# Patient Record
Sex: Male | Born: 2000 | Race: White | Hispanic: No | Marital: Single | Smoking: Never smoker
Health system: Southern US, Community
[De-identification: ages and names within clinical notes are randomized; demographics above are authoritative.]

## PROBLEM LIST (undated history)

## (undated) DIAGNOSIS — J45909 Unspecified asthma, uncomplicated: Secondary | ICD-10-CM

## (undated) HISTORY — PX: ADENOIDECTOMY: SUR15

---
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
## 2021-03-06 ENCOUNTER — Encounter: Payer: Self-pay | Admitting: Nurse Practitioner

## 2021-03-06 ENCOUNTER — Other Ambulatory Visit: Payer: Self-pay

## 2021-03-06 ENCOUNTER — Telehealth (INDEPENDENT_AMBULATORY_CARE_PROVIDER_SITE_OTHER): Payer: 59 | Admitting: Nurse Practitioner

## 2021-03-06 DIAGNOSIS — J069 Acute upper respiratory infection, unspecified: Secondary | ICD-10-CM

## 2021-03-06 NOTE — Progress Notes (Signed)
Subjective:    Patient ID: Marvin Mcknight, male    DOB: Sep 14, 2000, 21 y.o.   MRN: [id number]  HPI: Marvin Mcknight is a 21 y.o. male presenting virtually for fever.   Chief Complaint  Patient presents with   Fever   UPPER RESPIRATORY TRACT INFECTION Onset: last night after work Fever: yes Cough: no Shortness of breath: no Wheezing: no Chest pain: no Chest tightness: no Chest congestion: no Nasal congestion: no Runny nose: no Post nasal drip: no Sneezing: no Sore throat: no Swollen glands: yes Sinus pressure: yes; above eyes Headache: no Face pain: no Toothache: no Ear pain: no  Ear pressure: no  Eyes red/itching:no Eye drainage/crusting: no  Nausea: no  Vomiting: no Diarrhea: no  Change in appetite:  yes; decreased   Abdominal pain: no Loss of taste/smell: no  Rash: no Fatigue: yes Sick contacts: no Strep contacts: no  Context: stable Recurrent sinusitis: no Treatments attempted: Tylenol sinus Relief with OTC medications:  yes COVID testing: negative at home  No Known Allergies  Outpatient Encounter Medications as of 03/06/2021  Medication Sig   Albuterol Sulfate (PROAIR RESPICLICK) 108 (90 Base) MCG/ACT AEPB Inhale 2 puffs into the lungs every 4 (four) hours as needed.   Erenumab-aooe (AIMOVIG) 70 MG/ML SOAJ Inject 70 mg into the skin every 30 (thirty) days.   mometasone (ELOCON) 0.1 % cream Apply 1 application topically daily.   rizatriptan (MAXALT-MLT) 10 MG disintegrating tablet Take 1 tablet (10 mg total) by mouth as needed for migraine. May repeat in 2 hours if needed   No facility-administered encounter medications on file as of 03/06/2021.    There are no problems to display for this patient.   Past Medical History:  Diagnosis Date   Asthma     Relevant past medical, surgical, family and social history reviewed and updated as indicated. Interim medical history since our last visit reviewed.  Review of Systems Per HPI unless  specifically indicated above     Objective:    There were no vitals taken for this visit.  Wt Readings from Last 3 Encounters:  05/11/20 167 lb (75.8 kg) (73 %, Z= 0.63)*  04/06/20 171 lb (77.6 kg) (78 %, Z= 0.77)*  01/26/20 175 lb (79.4 kg) (82 %, Z= 0.92)*   * Growth percentiles are based on CDC (Boys, 2-20 Years) data.    Physical Exam Physical examination unable to be performed due to lack of equipment.  Patient talking in complete sentences during telemedicine visit.    Assessment & Plan:  1. Viral upper respiratory tract infection Acute x 1 day.  Obtain respiratory panel testing.  Reassured patient that symptoms are most consistent with a viral upper respiratory infection and explained lack of efficacy of antibiotics against viruses.  Discussed expected course and features suggestive of secondary bacterial infection.  Continue supportive care. Increase fluid intake with water or electrolyte solution like pedialyte. Encouraged acetaminophen as needed for fever/pain. Encouraged salt water gargling, chloraseptic spray and throat lozenges. Encouraged OTC guaifenesin. Encouraged saline sinus flushes and/or neti with humidified air. Follow up with no improvement after 7-10 days of symptoms.  - SARS-CoV-2 RNA (COVID-19) and Respiratory Viral Panel, Qualitative NAAT; Future    Follow up plan: Return if symptoms worsen or fail to improve.  This visit was completed via telephone due to the restrictions of the COVID-19 pandemic. All issues as above were discussed and addressed but no physical exam was performed. If it was felt that the patient  should be evaluated in the office, they were directed there. The patient verbally consented to this visit. Patient was unable to complete an audio/visual visit due to Technical difficulties.  Patient's cell phone camera would not allow access to [employer] application. Location of the patient: home Location of the provider: work Those involved with this  call:  Provider: Cathlean Marseilles, DNP, FNP-C CMA: Darrol Angel, CMA Front Desk/Registration: Claudine Mouton  Time spent on call:  5 minutes on the phone discussing health concerns. 12 minutes total spent in review of patient's record and preparation of their chart. I verified patient identity using two factors (patient name and date of birth). Patient consents verbally to being seen via telemedicine visit today.

## 2021-03-10 LAB — SARS-COV-2 RNA (COVID-19) RESP VIRAL PNL QL NAAT

## 2021-03-15 ENCOUNTER — Telehealth: Payer: Self-pay

## 2021-03-15 NOTE — Telephone Encounter (Signed)
Spoke with pt's mom, advised her without a DPR in file we cannot discuss his health/testing with her. She voiced understanding. She is concerned about pt's rash not improving. She reports pt has been to derm and they gave him oral pred and atbx but these are not helping. She is worried pt may have Mono and wants him tested for this. Advised pt needs appt to discuss further. She scheduled him an appt for this coming Monday, noted that pt needs to complete DPR. Nothing further needed at this time.

## 2021-03-15 NOTE — Telephone Encounter (Signed)
Pt's mom called in stating that pt was seen virtually by NP. Pt's mom states that rash is not getting better and would like to know what he was tested for? Please advise  Cb#: [id number]

## 2021-03-18 ENCOUNTER — Ambulatory Visit: Payer: 59 | Admitting: Family Medicine

## 2021-03-19 ENCOUNTER — Ambulatory Visit (INDEPENDENT_AMBULATORY_CARE_PROVIDER_SITE_OTHER): Payer: 59 | Admitting: Family Medicine

## 2021-03-19 ENCOUNTER — Encounter: Payer: Self-pay | Admitting: Family Medicine

## 2021-03-19 ENCOUNTER — Other Ambulatory Visit: Payer: Self-pay

## 2021-03-19 VITALS — BP 128/68 | HR 84 | Temp 98.2°F | Resp 18 | Ht 73.5 in | Wt 190.0 lb

## 2021-03-19 DIAGNOSIS — Z202 Contact with and (suspected) exposure to infections with a predominantly sexual mode of transmission: Secondary | ICD-10-CM | POA: Diagnosis not present

## 2021-03-19 DIAGNOSIS — L299 Pruritus, unspecified: Secondary | ICD-10-CM | POA: Diagnosis not present

## 2021-03-19 MED ORDER — PREDNISONE 20 MG PO TABS: ORAL_TABLET | ORAL | 0 refills | Status: DC

## 2021-03-19 MED ORDER — PERMETHRIN 5 % EX CREA: 1.0000 "application " | TOPICAL_CREAM | Freq: Once | CUTANEOUS | 2 refills | Status: AC

## 2021-03-19 NOTE — Progress Notes (Signed)
Subjective:    Patient ID: Marvin Mcknight, male    DOB: [birth year], 21 y.o.   MRN: [id number]       Patient is a very pleasant 21 year old Caucasian gentleman who presents today with itching all over his body.  He went to an emergency room to be treated for possible poison ivy.  There they gave him prednisone with no relief.  He also went to a dermatologist who prescribed an antibiotic, hydroxyzine, as well as a steroid cream.  None of this is helping the itching.  The itching is diffuse and widespread all over his body.  Particular he reports itching in between the webspaces of his fingers, his pelvic region, and his lower abdomen, and in his axilla.  There is not much of a rash to see.  There are few slightly erythematous small 1 mm papules.  There is numerous excoriations.  Rash seems to be limited to the webspaces of the fingers.  There are no vesicles or erythema or papules anywhere else.  He would also request STD testing Past Medical History:  Diagnosis Date   Asthma    History reviewed. No pertinent surgical history. Current Outpatient Medications on File Prior to Visit  Medication Sig Dispense Refill   Albuterol Sulfate (PROAIR RESPICLICK) 108 (90 Base) MCG/ACT AEPB Inhale 2 puffs into the lungs every 4 (four) hours as needed. 1 each 0   augmented betamethasone dipropionate (DIPROLENE-AF) 0.05 % cream Apply 1 application topically 2 (two) times daily.     Clindamycin-Benzoyl Per, Refr, gel Apply 1 application topically 2 (two) times daily.     doxycycline (VIBRAMYCIN) 100 MG capsule Take 100 mg by mouth 2 (two) times daily.     Erenumab-aooe (AIMOVIG) 70 MG/ML SOAJ Inject 70 mg into the skin every 30 (thirty) days. 1 pen 3   hydrOXYzine (ATARAX) 25 MG tablet Take 25 mg by mouth 3 (three) times daily as needed.     mometasone (ELOCON) 0.1 % cream Apply 1 application topically daily. 45 g 0   rizatriptan (MAXALT-MLT) 10 MG disintegrating tablet Take 1 tablet (10 mg total)  by mouth as needed for migraine. May repeat in 2 hours if needed 10 tablet 0   tretinoin (RETIN-A) 0.1 % cream Apply 1 application topically 3 (three) times a week.     No current facility-administered medications on file prior to visit.   No Known Allergies Social History   Socioeconomic History   Marital status: Single    Spouse name: Not on file   Number of children: Not on file   Years of education: Not on file   Highest education level: Not on file  Occupational History   Not on file  Tobacco Use   Smoking status: Never   Smokeless tobacco: Never  Substance and Sexual Activity   Alcohol use: Not on file   Drug use: Not on file   Sexual activity: Not on file  Other Topics Concern   Not on file  Social History Narrative   Not on file   Social Determinants of Health   Financial Resource Strain: Not on file  Food Insecurity: Not on file  Transportation Needs: Not on file  Physical Activity: Not on file  Stress: Not on file  Social Connections: Not on file  Intimate Partner Violence: Not on file   History reviewed. No pertinent family history. (dad may have add tendencies per mom) Mom has GERD and osteoarthritis     Review of Systems  All  other systems reviewed and are negative.     Objective:   Physical Exam Vitals reviewed.  Constitutional:      General: He is not in acute distress.    Appearance: He is well-developed. He is not diaphoretic.  HENT:     Head: Normocephalic and atraumatic.     Right Ear: Tympanic membrane and ear canal normal.     Left Ear: Tympanic membrane and ear canal normal.  Eyes:     Extraocular Movements: Extraocular movements intact.     Conjunctiva/sclera: Conjunctivae normal.     Pupils: Pupils are equal, round, and reactive to light.  Neck:     Thyroid: No thyroid mass.     Trachea: Trachea normal.  Cardiovascular:     Rate and Rhythm: Normal rate and regular rhythm.     Heart sounds: Normal heart sounds. No  murmur heard.   No friction rub. No gallop.  Pulmonary:     Effort: Pulmonary effort is normal. No respiratory distress.     Breath sounds: Normal breath sounds. No wheezing or rales.  Musculoskeletal:     Cervical back: Full passive range of motion without pain and normal range of motion.  Skin:    Findings: Rash present.  Neurological:     General: No focal deficit present.     Mental Status: He is alert and oriented to person, place, and time. Mental status is at baseline.     Cranial Nerves: No cranial nerve deficit.     Motor: No weakness or abnormal muscle tone.     Coordination: Coordination normal.     Gait: Gait normal.     Deep Tendon Reflexes: Reflexes are normal and symmetric.  Psychiatric:        Behavior: Behavior normal.        Thought Content: Thought content normal.        Judgment: Judgment normal.          Assessment & Plan:  Pruritus - Plan: CBC with Differential/Platelet, COMPLETE METABOLIC PANEL WITH GFR  Possible exposure to STD - Plan: HIV Antibody (routine testing w rflx), RPR I suspect the patient may have scabies.  I will treat him with Elimite cream head to toe and rinse off after 8 hours.  I will give him prednisone for itching.  Repeat Elimite cream in 1 week.  Check CBC and CMP to evaluate for any polycythemia or elevated liver function test although his exam today appears normal.  Also performed STD testing as requested

## 2021-03-20 LAB — CBC WITH DIFFERENTIAL/PLATELET
Absolute Monocytes: 476 cells/uL (ref 200–950)
Basophils Absolute: 48 cells/uL (ref 0–200)
Basophils Relative: 0.7 %
Eosinophils Absolute: 262 cells/uL (ref 15–500)
Eosinophils Relative: 3.8 %
HCT: 44.4 % (ref 38.5–50.0)
Hemoglobin: 14.7 g/dL (ref 13.2–17.1)
Lymphs Abs: 2208 cells/uL (ref 850–3900)
MCH: 28.5 pg (ref 27.0–33.0)
MCHC: 33.1 g/dL (ref 32.0–36.0)
MCV: 86.2 fL (ref 80.0–100.0)
MPV: 9.7 fL (ref 7.5–12.5)
Monocytes Relative: 6.9 %
Neutro Abs: 3905 cells/uL (ref 1500–7800)
Neutrophils Relative %: 56.6 %
Platelets: 328 10*3/uL (ref 140–400)
RBC: 5.15 10*6/uL (ref 4.20–5.80)
RDW: 12.5 % (ref 11.0–15.0)
Total Lymphocyte: 32 %
WBC: 6.9 10*3/uL (ref 3.8–10.8)

## 2021-03-20 LAB — RPR: RPR Ser Ql: NONREACTIVE

## 2021-03-20 LAB — COMPLETE METABOLIC PANEL WITH GFR
AG Ratio: 1.8 (calc) (ref 1.0–2.5)
ALT: 26 U/L (ref 8–46)
AST: 25 U/L (ref 12–32)
Albumin: 4.4 g/dL (ref 3.6–5.1)
Alkaline phosphatase (APISO): 49 U/L (ref 46–169)
BUN: 9 mg/dL (ref 7–20)
CO2: 26 mmol/L (ref 20–32)
Calcium: 9.9 mg/dL (ref 8.9–10.4)
Chloride: 99 mmol/L (ref 98–110)
Creat: 0.92 mg/dL (ref 0.60–1.24)
Globulin: 2.4 g/dL (calc) (ref 2.1–3.5)
Glucose, Bld: 115 mg/dL — ABNORMAL HIGH (ref 65–99)
Potassium: 4.1 mmol/L (ref 3.8–5.1)
Sodium: 140 mmol/L (ref 135–146)
Total Bilirubin: 0.7 mg/dL (ref 0.2–1.1)
Total Protein: 6.8 g/dL (ref 6.3–8.2)
eGFR: 123 mL/min/{1.73_m2} (ref >=60)

## 2021-03-20 LAB — HIV ANTIBODY (ROUTINE TESTING W REFLEX): HIV 1&2 Ab, 4th Generation: NONREACTIVE

## 2021-03-29 ENCOUNTER — Other Ambulatory Visit: Payer: Self-pay

## 2021-03-29 ENCOUNTER — Ambulatory Visit (INDEPENDENT_AMBULATORY_CARE_PROVIDER_SITE_OTHER): Payer: 59 | Admitting: Family Medicine

## 2021-03-29 ENCOUNTER — Encounter: Payer: Self-pay | Admitting: Family Medicine

## 2021-03-29 VITALS — BP 122/72 | HR 80 | Temp 98.1°F | Resp 18 | Ht 73.5 in | Wt 190.0 lb

## 2021-03-29 DIAGNOSIS — L299 Pruritus, unspecified: Secondary | ICD-10-CM | POA: Diagnosis not present

## 2021-03-29 MED ORDER — PREDNISONE 20 MG PO TABS: ORAL_TABLET | ORAL | 0 refills | Status: DC

## 2021-03-29 MED ORDER — PERMETHRIN 5 % EX CREA
TOPICAL_CREAM | Freq: Once | CUTANEOUS | 1 refills | Status: AC
Start: 2021-03-29 — End: 2021-03-29

## 2021-03-29 NOTE — Progress Notes (Signed)
Subjective:    Patient ID: Marvin Mcknight, male    DOB: 2000-09-01, 21 y.o.   MRN: [id number]  03/19/21 Patient is a very pleasant 21 year old Caucasian gentleman who presents today with itching all over his body.  He went to an emergency room to be treated for possible poison ivy.  There they gave him prednisone with no relief.  He also went to a dermatologist who prescribed an antibiotic, hydroxyzine, as well as a steroid cream.  None of this is helping the itching.  The itching is diffuse and widespread all over his body.  Particular he reports itching in between the webspaces of his fingers, his pelvic region, and his lower abdomen, and in his axilla.  There is not much of a rash to see.  There are few slightly erythematous small 1 mm papules.  There is numerous excoriations.  Rash seems to be limited to the webspaces of the fingers.  There are no vesicles or erythema or papules anywhere else.  He would also request STD testing.  At that time, my plan was:  I suspect the patient may have scabies.  I will treat him with Elimite cream head to toe and rinse off after 8 hours.  I will give him prednisone for itching.  Repeat Elimite cream in 1 week.  Check CBC and CMP to evaluate for any polycythemia or elevated liver function test although his exam today appears normal.  Also performed STD testing as requested  03/29/21 Patient states that he is doing better with the rash.  He states that the itching has improved substantially but he still has itching at times.  He is not sure that he has done the second dose of the Elimite?  At times he gives me conflicting information.  Sometimes he states that he is putting the cream on every night.  At other times he states that he has not done the second dose.  I asked him several different ways but I am unable to get a clear response.  It does seem that he is improving after using the Elimite.  Therefore I believe that he has some residual itching due to the  presence of the arthropods under the skin that have not completely decayed  Past Medical History:  Diagnosis Date   Asthma    No past surgical history on file. Current Outpatient Medications on File Prior to Visit  Medication Sig Dispense Refill   Albuterol Sulfate (PROAIR RESPICLICK) 108 (90 Base) MCG/ACT AEPB Inhale 2 puffs into the lungs every 4 (four) hours as needed. 1 each 0   augmented betamethasone dipropionate (DIPROLENE-AF) 0.05 % cream Apply 1 application topically 2 (two) times daily.     Erenumab-aooe (AIMOVIG) 70 MG/ML SOAJ Inject 70 mg into the skin every 30 (thirty) days. 1 pen 3   permethrin (ELIMITE) 5 % cream Apply topically.     rizatriptan (MAXALT-MLT) 10 MG disintegrating tablet Take 1 tablet (10 mg total) by mouth as needed for migraine. May repeat in 2 hours if needed 10 tablet 0   No current facility-administered medications on file prior to visit.   Allergies  Allergen Reactions   Fire Ant Anaphylaxis, Hives, Itching, Palpitations and Shortness Of Breath   Social History   Socioeconomic History   Marital status: Single    Spouse name: Not on file   Number of children: Not on file   Years of education: Not on file   Highest education level: Not on file  Occupational  History   Not on file  Tobacco Use   Smoking status: Never   Smokeless tobacco: Never  Substance and Sexual Activity   Alcohol use: Not on file   Drug use: Not on file   Sexual activity: Not on file  Other Topics Concern   Not on file  Social History Narrative   Not on file   Social Determinants of Health   Financial Resource Strain: Not on file  Food Insecurity: Not on file  Transportation Needs: Not on file  Physical Activity: Not on file  Stress: Not on file  Social Connections: Not on file  Intimate Partner Violence: Not on file   No family history on file. (dad may have add tendencies per mom) Mom has GERD and osteoarthritis     Review of Systems  All other systems  reviewed and are negative.     Objective:   Physical Exam Vitals reviewed.  Constitutional:      General: He is not in acute distress.    Appearance: He is well-developed. He is not diaphoretic.  HENT:     Head: Normocephalic and atraumatic.     Right Ear: Tympanic membrane and ear canal normal.     Left Ear: Tympanic membrane and ear canal normal.  Eyes:     Extraocular Movements: Extraocular movements intact.     Conjunctiva/sclera: Conjunctivae normal.     Pupils: Pupils are equal, round, and reactive to light.  Neck:     Thyroid: No thyroid mass.     Trachea: Trachea normal.  Cardiovascular:     Rate and Rhythm: Normal rate and regular rhythm.     Heart sounds: Normal heart sounds. No murmur heard.   No friction rub. No gallop.  Pulmonary:     Effort: Pulmonary effort is normal. No respiratory distress.     Breath sounds: Normal breath sounds. No wheezing or rales.  Musculoskeletal:     Cervical back: Full passive range of motion without pain and normal range of motion.  Skin:    Findings: Rash present.  Neurological:     General: No focal deficit present.     Mental Status: He is alert and oriented to person, place, and time. Mental status is at baseline.     Cranial Nerves: No cranial nerve deficit.     Motor: No weakness or abnormal muscle tone.     Coordination: Coordination normal.     Gait: Gait normal.     Deep Tendon Reflexes: Reflexes are normal and symmetric.  Psychiatric:        Behavior: Behavior normal.        Thought Content: Thought content normal.        Judgment: Judgment normal.          Assessment & Plan:  Pruritus The only rashes visible today are scratches in the webspace between his thumb and his index finger bilaterally.  He complains of some itching on his lower legs but there is no visible rash.  I recommended 1 additional dose of Elimite applied head to toe and rinse off after 8 hours.  This should be an adequate dose to totally  eliminate any live parasites.  I then recommended prednisone to help with the itching as I anticipate it may take another 7 days to completely resolve.

## 2021-04-04 ENCOUNTER — Ambulatory Visit: Payer: 59 | Admitting: Allergy

## 2021-05-22 ENCOUNTER — Ambulatory Visit: Payer: 59 | Admitting: Internal Medicine

## 2021-06-28 ENCOUNTER — Ambulatory Visit (INDEPENDENT_AMBULATORY_CARE_PROVIDER_SITE_OTHER): Payer: 59 | Admitting: Internal Medicine

## 2021-06-28 ENCOUNTER — Ambulatory Visit (INDEPENDENT_AMBULATORY_CARE_PROVIDER_SITE_OTHER): Payer: 59 | Admitting: Family Medicine

## 2021-06-28 ENCOUNTER — Encounter: Payer: Self-pay | Admitting: Internal Medicine

## 2021-06-28 VITALS — BP 118/56 | HR 74 | Temp 97.9°F | Ht 73.0 in | Wt 189.0 lb

## 2021-06-28 VITALS — BP 110/70 | HR 62 | Temp 98.4°F | Resp 16 | Ht 73.0 in | Wt 187.0 lb

## 2021-06-28 DIAGNOSIS — J4530 Mild persistent asthma, uncomplicated: Secondary | ICD-10-CM | POA: Diagnosis not present

## 2021-06-28 DIAGNOSIS — J3089 Other allergic rhinitis: Secondary | ICD-10-CM

## 2021-06-28 DIAGNOSIS — L503 Dermatographic urticaria: Secondary | ICD-10-CM | POA: Diagnosis not present

## 2021-06-28 DIAGNOSIS — G43109 Migraine with aura, not intractable, without status migrainosus: Secondary | ICD-10-CM | POA: Diagnosis not present

## 2021-06-28 DIAGNOSIS — H1013 Acute atopic conjunctivitis, bilateral: Secondary | ICD-10-CM | POA: Diagnosis not present

## 2021-06-28 DIAGNOSIS — L308 Other specified dermatitis: Secondary | ICD-10-CM

## 2021-06-28 DIAGNOSIS — H1010 Acute atopic conjunctivitis, unspecified eye: Secondary | ICD-10-CM

## 2021-06-28 MED ORDER — FLUTICASONE PROPIONATE 50 MCG/ACT NA SUSP: 1.0000 | Freq: Every day | NASAL | 2 refills | Status: AC

## 2021-06-28 MED ORDER — QVAR REDIHALER 40 MCG/ACT IN AERB: 2.0000 | INHALATION_SPRAY | Freq: Two times a day (BID) | RESPIRATORY_TRACT | 6 refills | Status: AC

## 2021-06-28 MED ORDER — RIZATRIPTAN BENZOATE 10 MG PO TBDP
10.0000 mg | ORAL_TABLET | ORAL | 4 refills | Status: AC | PRN
Start: 2021-06-28 — End: ?

## 2021-06-28 MED ORDER — MONTELUKAST SODIUM 10 MG PO TABS: 10.0000 mg | ORAL_TABLET | Freq: Every day | ORAL | 1 refills | Status: DC

## 2021-06-28 MED ORDER — CLOBETASOL PROPIONATE 0.05 % EX SOLN: 1.0000 "application " | Freq: Two times a day (BID) | CUTANEOUS | 0 refills | Status: AC

## 2021-06-28 MED ORDER — HYDROCORTISONE 2.5 % EX CREA: TOPICAL_CREAM | Freq: Two times a day (BID) | CUTANEOUS | 0 refills | Status: AC

## 2021-06-28 NOTE — Patient Instructions (Addendum)
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  Start Hydrocortisone cream twice a day for your eyelids   Mild persistent asthma: Not well controlled - Breathing test today showed: Inflammation in your lungs - Based on symptoms and breathing tests your asthma is now well controlled and we need to step up care  PLAN:  - Spacer sample and demonstration provided. - Daily controller medication(s):  Qvar 2 puffs twice daily  and Singulair 10 mg daily - Prior to physical activity: albuterol 2 puffs 10-15 minutes before physical activity. - Rescue medications: albuterol 4 puffs every 4-6 hours as needed - Changes during respiratory infections or worsening symptoms: Increase Qvar  to 4 puffs twice daily for TWO WEEKS. - Get Influenza Vaccine and appropriate Pneumonia and COVID 19 boosters  - Asthma control goals:  * Full participation in all desired activities (may need albuterol before activity) * Albuterol use two time or less a week on average (not counting use with activity) * Cough interfering with sleep two time or less a month * Oral steroids no more than once a year * No hospitalizations  Seasonal and perennial rhinitis: Not well controlled  - Testing today showed grass, weeds, trees, mold - Copy of test results provided.  - Avoidance measures provided.  - Start taking: Zyrtec  (cetirizine) 10mg  tablet once daily, Singulair (montelukast) 10mg  daily, Flonase (fluticasone) one spray per nostril daily, and Pataday (olopatadine) one drop per eye twice daily as needed - You can use an extra dose of the antihistamine, if needed, for breakthrough symptoms.  - Consider nasal saline rinses 1-2 times daily to remove allergens from the nasal cavities as well as help with mucous clearance (this is especially helpful to do before the nasal sprays are given) - Consider allergy shots as a means of long-term control and can reduce lifetime use of medications  - Allergy shots "re-train" and "reset" the immune system to ignore environmental allergens and decrease the resulting immune response to those allergens (sneezing, itchy watery eyes, runny nose, nasal congestion, etc).    - Allergy shots improve symptoms in 75-85%  - Allergy shots are the only potential permanent and disease modifying option  - We can discuss more at the next appointment if the medications are not working for you.  Follow up: 4 weeks   Thank you so much for letting me partake in your care today.  Don't hesitate to reach out if you have any additional concerns!  , MD  [facility] [place], [place]  Reducing Pollen Exposure  The American Academy of Allergy, Asthma and Immunology suggests the following steps to reduce your exposure to pollen during allergy seasons.    Do not hang sheets or clothing out to dry; pollen may collect on these items. Do not mow lawns or spend time around freshly cut grass; mowing stirs up pollen.  Keep windows closed at night.  Keep car windows closed while driving. Minimize morning activities outdoors, a time when pollen counts are usually at their highest. Stay indoors as much as possible when pollen counts or humidity is high and on windy days when pollen tends to remain in the air longer. Use air conditioning when possible.  Many air conditioners have  filters that trap the pollen spores. Use a HEPA room air filter to remove pollen form the indoor air you breathe.  Control of Mold Allergen   Mold and fungi can grow on a variety of surfaces provided certain temperature and moisture conditions exist.  Outdoor molds grow on plants, decaying vegetation and soil.  The major outdoor mold, Alternaria and Cladosporium, are found in very high numbers during hot and dry conditions.  Generally, a late Summer - Fall peak is seen for common outdoor fungal spores.  Rain will temporarily lower outdoor mold spore count, but counts rise rapidly when the rainy period ends.  The most important indoor molds are Aspergillus and Penicillium.  Dark, humid and poorly ventilated basements are ideal sites for mold growth.  The next most common sites of mold growth are the bathroom and the kitchen.  Outdoor (Seasonal) Mold Control  Positive outdoor molds via skin testing: Alternaria and Bipolaris (Helminthsporium)  Use air conditioning and keep windows closed Avoid exposure to decaying vegetation. Avoid leaf raking. Avoid grain handling. Consider wearing a face mask if working in moldy areas.    Indoor (Perennial) Mold Control   Positive indoor molds via skin testing: Aspergillus, Aureobasidium (Pullulara), and Phoma  Maintain humidity below 50%. Clean washable surfaces with 5% bleach solution. Remove sources e.g. contaminated carpets.

## 2021-06-28 NOTE — Progress Notes (Signed)
New Patient Note  RE: Marvin Mcknight MRN: [id number] DOB: 17-Jul-2000 Date of Office Visit: 06/28/2021  Consult requested by: Donita Brooks, MD Primary care provider: Donita Brooks, MD  Chief Complaint: Allergy Testing (Environmental: All/Food: No Hx), Eczema (Psorasis?????), and Asthma (Childhood - exercise induced Patient states he has not used his inhaler since 9th-10th grade)  History of Present Illness: I had the pleasure of seeing Helmut Hennon for initial evaluation at the [facility] on 06/28/2021. He is a 21 y.o. male, who is referred here by Donita Brooks, MD for the evaluation of allergic rhinitis, atopic dermatitis, asthma.  Chronic rhinitis: started as a young child  Symptoms include:  sinus infections, nasal congestion, rhinorrhea, post nasal drainage, and sneezing  Occurs seasonally-spring  Potential triggers: denies animal triggers  Treatments tried: denies daily meds  Previous allergy testing: no History of reflux/heartburn: no History of chronic sinusitis or sinus surgery:  denies surgery, but history of nasal trauma  Nonallergic triggers:  denies      Pruritus Daily pruritus associated with "little goose bumps".  Denies urticaria or angioedema.  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  Previously treated with 2 rounds prednisone, topical steroids which did not help  Benadryl has helped.  Does have dry flaky patches above his eyes and significant dandruff and scalp.  He is followed dermatology but has not mentioned his scalp issues to dermatology.  He is concerned for psoriasis of the scalp.  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  Current symptoms include  denies any wheeze, cough, dyspnea  0 daytime symptoms in past month, 0 nighttime awakenings in past month Using rescue inhaler denies use  Limitations to daily activity: none 0 ED visits, 0 UC visits and 0 oral steroids in the past year 0 number of lifetime hospitalizations, 0  number of lifetime intubations.  Identified Triggers: respiratory illness Prior PFTs or spirometry: none prior  Previously used therapies: albuterol .  Current regimen:  Maintenance: none  Rescue: Albuterol 2 puffs q4-6 hrs PRN, denies  prior to exercise  Up-to-date with pneumonia, vaccines. History of prior pneumonias: denies  History of prior COVID-19 infection: winter 2022  Smoking exposure: denies    Recent labs: Negative RPR, HIV; no normal CBC and CMP  Assessment and Plan: Mubashir is a 21 y.o. male with: Mild persistent asthma without complication - Plan: Spirometry with Graph, Allergy Test  Other specified dermatitis  Dermatographic urticaria - Plan: Allergy Test, Chronic Urticaria, TSH + free T4, Tryptase, C-reactive protein  Seasonal allergic conjunctivitis - Plan: Allergy Test  Other allergic rhinitis - Plan: Allergy Test Plan: Patient Instructions  Dermatographism  This can be a subtype of chronic spontaneous urticaria (CSU)  Although usually not associated with underlying conditions, we will screen these for you with urticaria labs:   TSH, tryptase, CU index, CRP Given your response to Benadryl we will go ahead and treat similarly to similarly to CSU  Start cetirizine 10 mg twice a day, can increase to cetirizine 20 mg twice a day as needed to control symptoms Goals to become completely itch and hive free before we taper antihistamines   Scalp Dermatitis  Follow-up with dermatology to discuss concerns for psoriasis We will start Clobetasol Shampoo twice daily for up to 2 weeks for scalp.  Start Hydrocortisone cream twice a day for your eyelids   Mild persistent asthma: Not well controlled - Breathing test today showed: Inflammation in your lungs - Based on  symptoms and breathing tests your asthma is now well controlled and we need to step up care  PLAN:  - Spacer sample and demonstration provided. - Daily controller medication(s):  Qvar 40mcg 2 puffs twice  daily  and Singulair 10 mg daily - Prior to physical activity: albuterol 2 puffs 10-15 minutes before physical activity. - Rescue medications: albuterol 4 puffs every 4-6 hours as needed - Changes during respiratory infections or worsening symptoms: Increase Qvar  to 4 puffs twice daily for TWO WEEKS. - Get Influenza Vaccine and appropriate Pneumonia and COVID 19 boosters  - Asthma control goals:  * Full participation in all desired activities (may need albuterol before activity) * Albuterol use two time or less a week on average (not counting use with activity) * Cough interfering with sleep two time or less a month * Oral steroids no more than once a year * No hospitalizations  Seasonal and perennial rhinitis: Not well controlled  - Testing today showed grass, weeds, trees, mold - Copy of test results provided.  - Avoidance measures provided.  - Start taking: Zyrtec (cetirizine) 10mg  tablet once daily, Singulair (montelukast) 10mg  daily, Flonase (fluticasone) one spray per nostril daily, and Pataday (olopatadine) one drop per eye twice daily as needed - You can use an extra dose of the antihistamine, if needed, for breakthrough symptoms.  - Consider nasal saline rinses 1-2 times daily to remove allergens from the nasal cavities as well as help with mucous clearance (this is especially helpful to do before the nasal sprays are given) - Consider allergy shots as a means of long-term control and can reduce lifetime use of medications  - Allergy shots "re-train" and "reset" the immune system to ignore environmental allergens and decrease the resulting immune response to those allergens (sneezing, itchy watery eyes, runny nose, nasal congestion, etc).    - Allergy shots improve symptoms in 75-85%  - Allergy shots are the only potential permanent and disease modifying option  - We can discuss more at the next appointment if the medications are not working for you.  Follow up: 4 weeks   Thank  you so much for letting me partake in your care today.  Don't hesitate to reach out if you have any additional concerns!  Ferol LuzEvelyn Alize Acy, MD  [facility] [place] [place]  Reducing Pollen Exposure  The American Academy of Allergy, Asthma and Immunology suggests the following steps to reduce your exposure to pollen during allergy seasons.    Do not hang sheets or clothing out to dry; pollen may collect on these items. Do not mow lawns or spend time around freshly cut grass; mowing stirs up pollen. Keep windows closed at night.  Keep car windows closed while driving. Minimize morning activities outdoors, a time when pollen counts are usually at their highest. Stay indoors as much as possible when pollen counts or humidity is high and on windy days when pollen tends to remain in the air longer. Use air conditioning when possible.  Many air conditioners have filters that trap the pollen spores. Use a HEPA room air filter to remove pollen form the indoor air you breathe.  Control of Mold Allergen   Mold and fungi can grow on a variety of surfaces provided certain temperature and moisture conditions exist.  Outdoor molds grow on plants, decaying vegetation and soil.  The major outdoor mold, Alternaria and Cladosporium, are found in very high numbers during hot and dry conditions.  Generally, a late Summer -  Fall peak is seen for common outdoor fungal spores.  Rain will temporarily lower outdoor mold spore count, but counts rise rapidly when the rainy period ends.  The most important indoor molds are Aspergillus and Penicillium.  Dark, humid and poorly ventilated basements are ideal sites for mold growth.  The next most common sites of mold growth are the bathroom and the kitchen.  Outdoor (Seasonal) Mold Control  Positive outdoor molds via skin testing: Alternaria and Bipolaris (Helminthsporium)  Use air conditioning and keep windows closed Avoid exposure to decaying  vegetation. Avoid leaf raking. Avoid grain handling. Consider wearing a face mask if working in moldy areas.    Indoor (Perennial) Mold Control   Positive indoor molds via skin testing: Aspergillus, Aureobasidium (Pullulara), and Phoma  Maintain humidity below 50%. Clean washable surfaces with 5% bleach solution. Remove sources e.g. contaminated carpets.       Return in about 4 weeks (around 07/26/2021).  Meds ordered this encounter  Medications   clobetasol (TEMOVATE) 0.05 % external solution    Sig: Apply 1 application. topically 2 (two) times daily.    Dispense:  50 mL    Refill:  0   beclomethasone (QVAR REDIHALER) 40 MCG/ACT inhaler    Sig: Inhale 2 puffs into the lungs 2 (two) times daily.    Dispense:  1 each    Refill:  6   hydrocortisone 2.5 % cream    Sig: Apply topically 2 (two) times daily.    Dispense:  30 g    Refill:  0   fluticasone (FLONASE) 50 MCG/ACT nasal spray    Sig: Place 1 spray into both nostrils daily.    Dispense:  16 g    Refill:  2   montelukast (SINGULAIR) 10 MG tablet    Sig: Take 1 tablet (10 mg total) by mouth at bedtime.    Dispense:  90 tablet    Refill:  1   Lab Orders         Chronic Urticaria         TSH + free T4         Tryptase         C-reactive protein      Other allergy screening: Asthma: yes Rhino conjunctivitis: yes Food allergy: no Medication allergy: no Hymenoptera allergy: no Urticaria:  as above Eczema: as above  History of recurrent infections suggestive of immunodeficency: no  Diagnostics: Spirometry:  Tracings reviewed. His effort: Good reproducible efforts. FVC: 5.79 L FEV1: 3.60 L, 71% predicted FEV1/FVC ratio: 62% Interpretation: Spirometry consistent with moderate obstructive disease.  After 4 puffs of albuterol, FEV1 increased by 390 cc, 11%; this is not consistent with a significant postbronchodilator response but is suggestive of reversible obstruction Please see scanned spirometry results  for details.  Skin Testing: Environmental allergy panel. Positive to grass, weeds, trees, mold Results interpreted by myself and discussed with patient/family.  Airborne Adult Perc - 06/28/21 1000     Time Antigen Placed 1002    Allergen Manufacturer Greer    Location Back    Number of Test 59    1. Control-Buffer 50% Glycerol Negative    2. Control-Histamine 1 mg/ml 4+    3. Albumin saline Negative    4. Bahia 3+    5. [place] 3+    6. Johnson 3+    7. Kentucky Blue 3+    8. Meadow Fescue 4+    9. Perennial Rye 3+    10. Sweet Vernal Negative  11. Timothy Negative    12. Cocklebur 3+    13. Burweed Marshelder Negative    14. Ragweed, short 4+    15. Ragweed, Giant 4+    16. Plantain,  English Negative    17. Lamb's Quarters 4+    18. Sheep Sorrell 4+    19. Rough Pigweed 3+    20. Marsh Elder, Rough 3+    21. Mugwort, Common 3+    22. Ash mix Negative    23. Birch mix 3+    24. Beech American 3+    25. Box, Elder 3+    26. Cedar, red Negative    27. Cottonwood, Guinea-Bissau Negative    28. Elm mix 4+    29. Hickory 4+    30. Maple mix Negative    31. Oak, Guinea-[place]issau mix 3+    32. Pecan Pollen 3+    33. Pine mix 3+    34. Sycamore Eastern 3+    35. Walnut, Black Pollen Negative    36. Alternaria alternata 3+    37. Cladosporium Herbarum Negative    38. Aspergillus mix 4+    39. Penicillium mix Negative    40. Bipolaris sorokiniana (Helminthosporium) 3+    41. Drechslera spicifera (Curvularia) Negative    42. Mucor plumbeus Negative    43. Fusarium moniliforme Negative    44. Aureobasidium pullulans (pullulara) 3+    45. Rhizopus oryzae Negative    46. Botrytis cinera Negative    47. Epicoccum nigrum Negative    48. Phoma betae 4+    49. Candida Albicans Negative    50. Trichophyton mentagrophytes Negative    51. Mite, D Farinae  5,000 AU/ml Negative    52. Mite, D Pteronyssinus  5,000 AU/ml Negative    53. Cat Hair 10,000 BAU/ml Negative    54.  Dog Epithelia  Negative    55. Mixed Feathers Negative    56. Horse Epithelia Negative    57. Cockroach, German Negative    58. Mouse Negative    59. Tobacco Leaf 4+             Past Medical History: There are no problems to display for this patient.  Past Medical History:  Diagnosis Date   Asthma    Past Surgical History: Past Surgical History:  Procedure Laterality Date   ADENOIDECTOMY     Medication List:  Current Outpatient Medications  Medication Sig Dispense Refill   Albuterol Sulfate (PROAIR RESPICLICK) 108 (90 Base) MCG/ACT AEPB Inhale 2 puffs into the lungs every 4 (four) hours as needed. 1 each 0   beclomethasone (QVAR REDIHALER) 40 MCG/ACT inhaler Inhale 2 puffs into the lungs 2 (two) times daily. 1 each 6   clobetasol (TEMOVATE) 0.05 % external solution Apply 1 application. topically 2 (two) times daily. 50 mL 0   clobetasol cream (TEMOVATE) 0.05 % Apply 1 application. topically as directed.     fluticasone (FLONASE) 50 MCG/ACT nasal spray Place 1 spray into both nostrils daily. 16 g 2   hydrocortisone 2.5 % cream Apply topically 2 (two) times daily. 30 g 0   montelukast (SINGULAIR) 10 MG tablet Take 1 tablet (10 mg total) by mouth at bedtime. 90 tablet 1   rizatriptan (MAXALT-MLT) 10 MG disintegrating tablet Take 1 tablet (10 mg total) by mouth as needed for migraine. May repeat in 2 hours if needed 10 tablet 0   No current facility-administered medications for this visit.   Allergies: Allergies  Allergen Reactions  Fire [employer] Anaphylaxis, Hives, Itching, Palpitations and Shortness Of Breath   Social History: Social History   Socioeconomic History   Marital status: Single    Spouse name: Not on file   Number of children: Not on file   Years of education: Not on file   Highest education level: Not on file  Occupational History   Not on file  Tobacco Use   Smoking status: Never    Passive exposure: Never   Smokeless tobacco: Never  Vaping Use   Vaping Use: Never  used  Substance and Sexual Activity   Alcohol use: Never   Drug use: Never   Sexual activity: Yes    Birth control/protection: Condom  Other Topics Concern   Not on file  Social History Narrative   Not on file   Social Determinants of Health   Financial Resource Strain: Not on file  Food Insecurity: Not on file  Transportation Needs: Not on file  Physical Activity: Not on file  Stress: Not on file  Social Connections: Not on file   Lives in a single-family home that is 20 years old.  There are no roaches in the house and bed is 2 feet off the floor.  They do use dust mite precautions on bed but not pillows.  He does have a job where he is exposed to fumes, chemicals and dust.  There is no HEPA filter in the home and home [place] not near an interstate industrial area. Smoking: Denies exposure Occupation: Works in Personal assistant History: Immunologist in the house: no Engineer, civil (consulting) in the family room: yes Carpet in the bedroom: yes Heating: gas Cooling: central Pet: yes dog and cat with access to bedroom  Family History: No family history on file.   ROS: All others negative except as noted per HPI.   Objective: BP 110/70   Pulse 62   Temp 98.4 F (36.9 C)   Resp 16   Ht 6\' 1"  (1.854 m)   Wt 187 lb (84.8 kg)   SpO2 97%   BMI 24.67 kg/m  Body mass index is 24.67 kg/m.  General Appearance:  Alert, cooperative, no distress, appears stated age  Head:  Normocephalic, without obvious abnormality, atraumatic  Eyes:  Conjunctiva clear, EOM's intact  Nose: Nares normal,  erythematous nasal mucosa, hypertrophic turbinates, no visible anterior polyps, and septum midline  Throat: Lips, tongue normal; teeth and gums normal, tonsils 2+, no tonsillar exudate, and + cobblestoning  Neck: Supple, symmetrical  Lungs:   clear to auscultation bilaterally, Respirations unlabored, no coughing  Heart:  regular rate and rhythm and no murmur, Appears well perfused   Extremities: No edema  Skin: Skin color, texture, turgor normal,  on visualized portions of skin, dry flaky patch above left eyelid, diffuse flaky scalp with areas of erythema  Neurologic: No gross deficits   The plan was reviewed with the patient/family, and all questions/concerned were addressed.  It was my pleasure to see Jashad today and participate in his care. Please feel free to contact me with any questions or concerns.  Sincerely,  Earna Coder, MD Allergy & Immunology  [facility] office: [phone] [facility] office: [phone]

## 2021-06-28 NOTE — Progress Notes (Signed)
Subjective:    Patient ID: Marvin Mcknight, male    DOB: 2000-10-19, 21 y.o.   MRN: [id number]   Patient is requesting a refill on Maxalt.  He gets 1 severe headache every 60 days approximately.  He takes Maxalt at the first sign of headache, the headache will go away rapidly.  He states that he will have a visual aura.  His vision will go blurry.  If he does not get the Maxalt in time, he will have a severe debilitating pulsatile headache.  If he takes the Maxalt for headache we will work.  Without the Maxalt he will have to throw up for the headache to go away.  If he takes the Maxalt he does not develop severe nausea.  He denies any neurologic deficits.  He he denies any specific triggers.  He states the headaches occur randomly but thankfully they occur infrequently.  At the present time he anticipates that he has not taken Maxalt in the last 2 months. Past Medical History:  Diagnosis Date   Asthma    Past Surgical History:  Procedure Laterality Date   ADENOIDECTOMY     Current Outpatient Medications on File Prior to Visit  Medication Sig Dispense Refill   Albuterol Sulfate (PROAIR RESPICLICK) 108 (90 Base) MCG/ACT AEPB Inhale 2 puffs into the lungs every 4 (four) hours as needed. 1 each 0   beclomethasone (QVAR REDIHALER) 40 MCG/ACT inhaler Inhale 2 puffs into the lungs 2 (two) times daily. 1 each 6   clobetasol (TEMOVATE) 0.05 % external solution Apply 1 application. topically 2 (two) times daily. 50 mL 0   clobetasol cream (TEMOVATE) 0.05 % Apply 1 application. topically as directed.     fluticasone (FLONASE) 50 MCG/ACT nasal spray Place 1 spray into both nostrils daily. 16 g 2   hydrocortisone 2.5 % cream Apply topically 2 (two) times daily. 30 g 0   montelukast (SINGULAIR) 10 MG tablet Take 1 tablet (10 mg total) by mouth at bedtime. 90 tablet 1   No current facility-administered medications on file prior to visit.   Allergies  Allergen Reactions   Fire Ant Anaphylaxis, Hives,  Itching, Palpitations and Shortness Of Breath   Social History   Socioeconomic History   Marital status: Single    Spouse name: Not on file   Number of children: Not on file   Years of education: Not on file   Highest education level: Not on file  Occupational History   Not on file  Tobacco Use   Smoking status: Never    Passive exposure: Never   Smokeless tobacco: Never  Vaping Use   Vaping Use: Never used  Substance and Sexual Activity   Alcohol use: Never   Drug use: Never   Sexual activity: Yes    Birth control/protection: Condom  Other Topics Concern   Not on file  Social History Narrative   Not on file   Social Determinants of Health   Financial Resource Strain: Not on file  Food Insecurity: Not on file  Transportation Needs: Not on file  Physical Activity: Not on file  Stress: Not on file  Social Connections: Not on file  Intimate Partner Violence: Not on file   No family history on file. (dad may have add tendencies per mom) Mom has GERD and osteoarthritis     Review of Systems  All other systems reviewed and are negative.     Objective:   Physical Exam Vitals reviewed.  Constitutional:  General: He is not in acute distress.    Appearance: He is well-developed. He is not diaphoretic.  HENT:     Head: Normocephalic and atraumatic.  Eyes:     Extraocular Movements: Extraocular movements intact.     Conjunctiva/sclera: Conjunctivae normal.     Pupils: Pupils are equal, round, and reactive to light.  Neck:     Thyroid: No thyroid mass.     Trachea: Trachea normal.  Cardiovascular:     Rate and Rhythm: Normal rate and regular rhythm.     Heart sounds: Normal heart sounds. No murmur heard.   No friction rub. No gallop.  Pulmonary:     Effort: Pulmonary effort is normal. No respiratory distress.     Breath sounds: Normal breath sounds. No wheezing or rales.  Musculoskeletal:     Cervical back: Full passive range of motion without pain and  normal range of motion.  Neurological:     General: No focal deficit present.     Mental Status: He is alert and oriented to person, place, and time. Mental status is at baseline.     Cranial Nerves: No cranial nerve deficit.     Motor: No weakness or abnormal muscle tone.     Coordination: Coordination normal.     Gait: Gait normal.  Psychiatric:        Behavior: Behavior normal.        Thought Content: Thought content normal.        Judgment: Judgment normal.          Assessment & Plan:  Migraine with aura and without status migrainosus, not intractable At the present time, the patient has very infrequent migraines that are well controlled with Maxalt.  Therefore I will be glad to refill the medication.  I cautioned the patient that he is having to take Maxalt more than 4 times a month I would recommend focusing on prevention.  We could certainly consider one of the CGRP receptor blockers.  However at the present time, I see no reason to switch medication as 10, Maxalt pills last more than 6 months

## 2021-07-10 LAB — TRYPTASE: Tryptase: 2.6 ug/L (ref 2.2–13.2)

## 2021-07-10 LAB — CHRONIC URTICARIA: cu index: <1.7 (ref <10)

## 2021-07-10 LAB — TSH+FREE T4
Free T4: 1.28 ng/dL (ref 0.93–1.60)
TSH: 2.22 u[IU]/mL (ref 0.450–4.500)

## 2021-07-10 LAB — C-REACTIVE PROTEIN: CRP: <1 mg/L (ref 0–10)

## 2021-07-15 NOTE — Progress Notes (Signed)
[facility] labs all returned normal.  It does not look like there is an underlying systemic cause of patient's symptoms.  This does not change treatment plan and will continue Zyrtec 1-2 tabs daily as needed to control symptoms.  He should follow-up with me as planned.  Thank you for letting patient know!  [facility]

## 2021-07-26 ENCOUNTER — Ambulatory Visit: Payer: 59 | Admitting: Internal Medicine

## 2021-08-02 ENCOUNTER — Ambulatory Visit (INDEPENDENT_AMBULATORY_CARE_PROVIDER_SITE_OTHER): Payer: 59 | Admitting: Internal Medicine

## 2021-08-02 ENCOUNTER — Encounter: Payer: Self-pay | Admitting: Internal Medicine

## 2021-08-02 VITALS — BP 112/68 | HR 64 | Temp 98.1°F | Resp 16 | Wt 190.0 lb

## 2021-08-02 DIAGNOSIS — J4530 Mild persistent asthma, uncomplicated: Secondary | ICD-10-CM | POA: Diagnosis not present

## 2021-08-02 DIAGNOSIS — L503 Dermatographic urticaria: Secondary | ICD-10-CM | POA: Diagnosis not present

## 2021-08-02 DIAGNOSIS — J3089 Other allergic rhinitis: Secondary | ICD-10-CM

## 2021-08-02 DIAGNOSIS — L209 Atopic dermatitis, unspecified: Secondary | ICD-10-CM

## 2021-08-02 MED ORDER — MONTELUKAST SODIUM 10 MG PO TABS: 10.0000 mg | ORAL_TABLET | Freq: Every day | ORAL | 1 refills | Status: AC

## 2021-08-02 NOTE — Progress Notes (Signed)
Follow Up Note  RE: Marvin Mcknight MRN: [id number] DOB: 04-20-2000 Date of Office Visit: 08/02/2021  Referring provider: Donita Brooks, MD Primary care provider: Donita Brooks, MD  Chief Complaint: Asthma  History of Present Illness: I had the pleasure of seeing Marvin Mcknight for a follow up visit at the [facility] on 08/02/2021. He is a 21 y.o. male, who is being followed for allergic rhinitis, persistent asthma, dermatographic urticaria, scalp dermatitis. His previous allergy office visit was on 06/28/2021 with Dr. Marlynn Perking. Today is a regular follow up visit.  History obtained from patient.  ASTHMA - Medical therapy: Qvar 40 mcg 2 puffs twice daily, Singulair 40 mg daily - Rescue inhaler use: no use  - Symptoms: denies any cough, wheeze, shortness of breath  - Exacerbation history: 0 ABX for respiratory illness since last visit, 0 OCS, 0ED, 0 UC visits in the past year  - ACT: 25 /25 - Adverse effects of medication: denies  - Previous FEV1: 3.6 L, 71% - Biologic Labs not indicated   Allergic rhinitis: current therapy: Zyrtec 10 mg daily, Singulair 10 mg daily, Flonase 1 spray per nostril daily, Pataday eyedrops as needed,  symptoms improved symptoms include:  denies any symptoms  Previous allergy testing:  06/28/21: grass, weeds, trees, mold History of reflux/heartburn: no Interested in Allergy Immunotherapy: no  Dermatographism   Current therapy: zyrtec 10mg  daily  - well controlled, denies any breakthrough    Scalp Dermatitis  -Good response to clobetasol shampoo, he also switched his normal shampoo which has helped as well.  He has not seen dermatology yet but still plans to follow through with that referral  Assessment and Plan: Marvin Mcknight is a 21 y.o. male with: Other allergic rhinitis  Mild persistent asthma without complication - Plan: Spirometry with Graph  Dermatographic urticaria  Atopic dermatitis of scalp Plan: Patient  Instructions  Dermatographism - well controlled  Urticaria labs: TSH, tryptase, CU index, CRP were normal 06/28/21  -Continue zyrtec 10mg  daily    Scalp Dermatitis - improved  Continue to follow with  dermatology Continue Clobetasol Shampoo twice daily for up to 2 weeks for scalp as needed .  Continue Hydrocortisone cream twice a day for your eyelids as needed   Mild persistent asthma: well controlled  - Breathing test today showed: looked great!   PLAN:  - Daily controller medication(s):  Qvar 06/30/21 2 puffs twice daily  and Singulair 10 mg daily - Prior to physical activity: albuterol 2 puffs 10-15 minutes before physical activity. - Rescue medications: albuterol 4 puffs every 4-6 hours as needed - Changes during respiratory infections or worsening symptoms: Increase Qvar  to 4 puffs twice daily for TWO WEEKS. - Get Influenza Vaccine and appropriate Pneumonia and COVID 19 boosters  - Asthma control goals:  * Full participation in all desired activities (may need albuterol before activity) * Albuterol use two time or less a week on average (not counting use with activity) * Cough interfering with sleep two time or less a month * Oral steroids no more than once a year * No hospitalizations  Seasonal and perennial rhinitis: well controlled  - Previous Testing 06/28/21:  grass, weeds, trees, mold - Continue avoidance measures  - Continue taking: Zyrtec (cetirizine) 10mg  tablet once daily, Singulair (montelukast) 10mg  daily, Flonase (fluticasone) one spray per nostril daily, and Pataday (olopatadine) one drop per eye twice daily as needed - You can use an extra dose of the antihistamine, if needed,  for breakthrough symptoms.  - Consider nasal saline rinses 1-2 times daily to remove allergens from the nasal cavities as well as help with mucous clearance (this is especially helpful to do before the nasal sprays are given) - Consider allergy shots as a means of long-term control and can reduce  lifetime use of medications    Follow up: 3-4 months   We will likely be in our new office for your next appointment.   Thank you so much for letting me partake in your care today.  Don't hesitate to reach out if you have any additional concerns!  Ferol Luz, MD  [facility] [place] [place]      No follow-ups on file.  Meds ordered this encounter  Medications   montelukast (SINGULAIR) 10 MG tablet    Sig: Take 1 tablet (10 mg total) by mouth at bedtime.    Dispense:  90 tablet    Refill:  1    Lab Orders  No laboratory test(s) ordered today   Diagnostics: Spirometry:  Tracings reviewed. His effort: Good reproducible efforts. FVC: 5.96 L FEV1: 4.99 L, 101% predicted FEV1/FVC ratio: 84% Interpretation: Spirometry consistent with normal pattern.  Please see scanned spirometry results for details.   Results interpreted by myself during this encounter and discussed with patient/family.   Medication List:  Current Outpatient Medications  Medication Sig Dispense Refill   Albuterol Sulfate (PROAIR RESPICLICK) 108 (90 Base) MCG/ACT AEPB Inhale 2 puffs into the lungs every 4 (four) hours as needed. 1 each 0   beclomethasone (QVAR REDIHALER) 40 MCG/ACT inhaler Inhale 2 puffs into the lungs 2 (two) times daily. 1 each 6   clobetasol (TEMOVATE) 0.05 % external solution Apply 1 application. topically 2 (two) times daily. 50 mL 0   clobetasol cream (TEMOVATE) 0.05 % Apply 1 application. topically as directed.     fluticasone (FLONASE) 50 MCG/ACT nasal spray Place 1 spray into both nostrils daily. 16 g 2   hydrocortisone 2.5 % cream Apply topically 2 (two) times daily. 30 g 0   montelukast (SINGULAIR) 10 MG tablet Take 1 tablet (10 mg total) by mouth at bedtime. 90 tablet 1   rizatriptan (MAXALT-MLT) 10 MG disintegrating tablet Take 1 tablet (10 mg total) by mouth as needed for migraine. May repeat in 2 hours if needed 10 tablet 4   EPINEPHrine 0.3 mg/0.3 mL IJ  SOAJ injection      No current facility-administered medications for this visit.   Allergies: Allergies  Allergen Reactions   Fire Ant Anaphylaxis, Hives, Itching, Palpitations and Shortness Of Breath   I reviewed his past medical history, social history, family history, and environmental history and no significant changes have been reported from his previous visit.  ROS: All others negative except as noted per HPI.   Objective: BP 112/68   Pulse 64   Temp 98.1 F (36.7 C) (Temporal)   Resp 16   Wt 190 lb (86.2 kg)   SpO2 99%   BMI 25.07 kg/m  Body mass index is 25.07 kg/m. General Appearance:  Alert, cooperative, no distress, appears stated age  Head:  Normocephalic, without obvious abnormality, atraumatic  Eyes:  Conjunctiva clear, EOM's intact  Nose: Nares normal,  erythematous nasal mucosa, no visible anterior polyps, and septum midline  Throat: Lips, tongue normal; teeth and gums normal, no tonsillar exudate and + cobblestoning  Neck: Supple, symmetrical  Lungs:   clear to auscultation bilaterally, Respirations unlabored, no coughing  Heart:  regular rate  and rhythm and no murmur, Appears well perfused  Extremities: No edema  Skin: Skin color, texture, turgor normal, no rashes or lesions on visualized portions of skin, scalp erythema with rare flaking   Neurologic: No gross deficits   Previous notes and tests were reviewed. The plan was reviewed with the patient/family, and all questions/concerned were addressed.  It was my pleasure to see Marvin Mcknight today and participate in his care. Please feel free to contact me with any questions or concerns.  Sincerely,  Ferol Luz, MD  Allergy & Immunology  [facility] Office: [phone]

## 2021-08-02 NOTE — Patient Instructions (Addendum)
Dermatographism - well controlled  Urticaria labs: TSH, tryptase, CU index, CRP were normal 06/28/21  -Continue zyrtec 10mg  daily    Scalp Dermatitis - improved  Continue to follow with  dermatology Continue Clobetasol Shampoo twice daily for up to 2 weeks for scalp as needed .  Continue Hydrocortisone cream twice a day for your eyelids as needed   Mild persistent asthma: well controlled  - Breathing test today showed: looked great!   PLAN:  - Daily controller medication(s):  Qvar 2 puffs twice daily  and Singulair 10 mg daily - Prior to physical activity: albuterol 2 puffs 10-15 minutes before physical activity. - Rescue medications: albuterol 4 puffs every 4-6 hours as needed - Changes during respiratory infections or worsening symptoms: Increase Qvar  to 4 puffs twice daily for TWO WEEKS. - Get Influenza Vaccine and appropriate Pneumonia and COVID 19 boosters  - Asthma control goals:  * Full participation in all desired activities (may need albuterol before activity) * Albuterol use two time or less a week on average (not counting use with activity) * Cough interfering with sleep two time or less a month * Oral steroids no more than once a year * No hospitalizations  Seasonal and perennial rhinitis: well controlled  - Previous Testing 06/28/21:  grass, weeds, trees, mold - Continue avoidance measures  - Continue taking: Zyrtec (cetirizine) 10mg  tablet once daily, Singulair (montelukast) 10mg  daily, Flonase (fluticasone) one spray per nostril daily, and Pataday (olopatadine) one drop per eye twice daily as needed - You can use an extra dose of the antihistamine, if needed, for breakthrough symptoms.  - Consider nasal saline rinses 1-2 times daily to remove allergens from the nasal cavities as well as help with mucous clearance (this is especially helpful to do before the nasal sprays are given) - Consider allergy shots as a means of long-term control and can reduce lifetime use  of medications    Follow up: 3-4 months   We will likely be in our new office for your next appointment.   Thank you so much for letting me partake in your care today.  Don't hesitate to reach out if you have any additional concerns!  06/30/21, MD  [facility] [place], [place]

## 2021-08-08 ENCOUNTER — Encounter: Payer: Self-pay | Admitting: Family Medicine

## 2021-08-12 ENCOUNTER — Other Ambulatory Visit: Payer: Self-pay | Admitting: Family Medicine

## 2021-08-12 MED ORDER — PERMETHRIN 5 % EX CREA: 1.0000 | TOPICAL_CREAM | Freq: Once | CUTANEOUS | 0 refills | Status: AC

## 2021-12-01 IMAGING — CT CT MAXILLOFACIAL W/O CM
3 series · 16 of 47 positions shown, 19 images · non-contrast
Comparison: None.

CLINICAL DATA: Epistaxis, hit in face by baseball

EXAM:
CT MAXILLOFACIAL WITHOUT CONTRAST
TECHNIQUE: Multidetector CT imaging of the maxillofacial structures was
performed. Multiplanar CT image reconstructions were also generated.

[Series 3: facial/ orbits 2.0 h30s · axial · 0.38mm/px · z∈[-228,-78]mm · 10 of 89 slices shown, 13 images]
[im 7/89  brain]
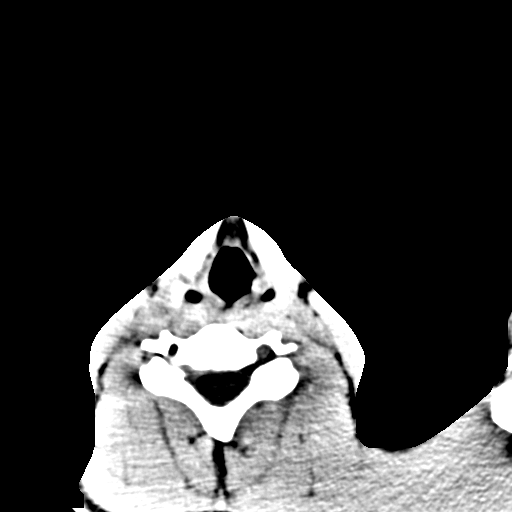
[im 7/89  bone]
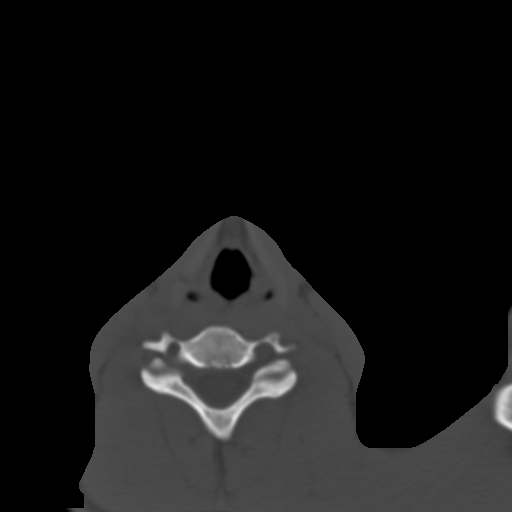
[im 16/89  bone]
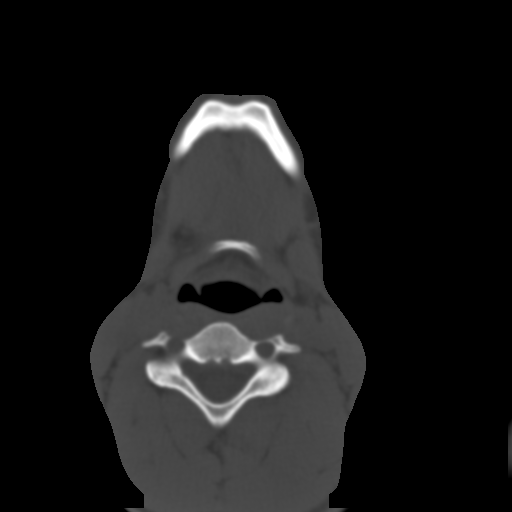
[im 25/89  bone]
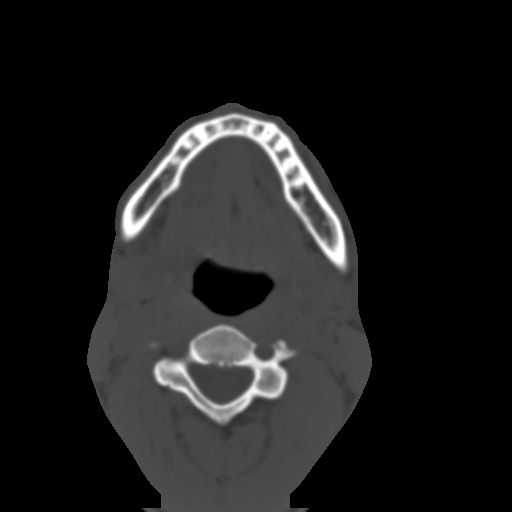
[im 31/89  bone]
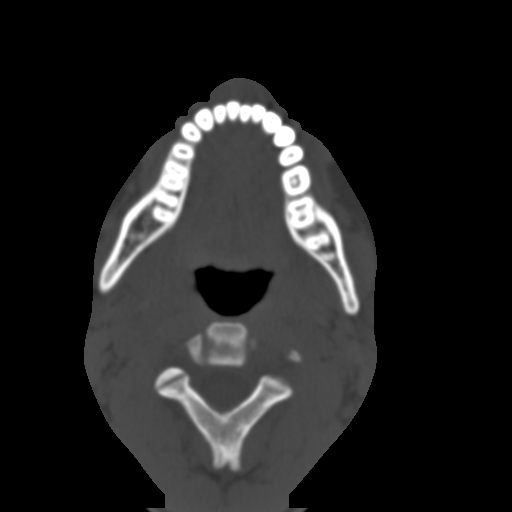
[im 40/89  brain]
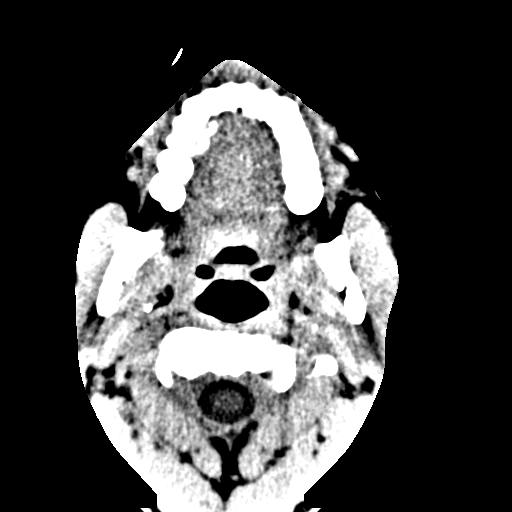
[im 40/89  bone]
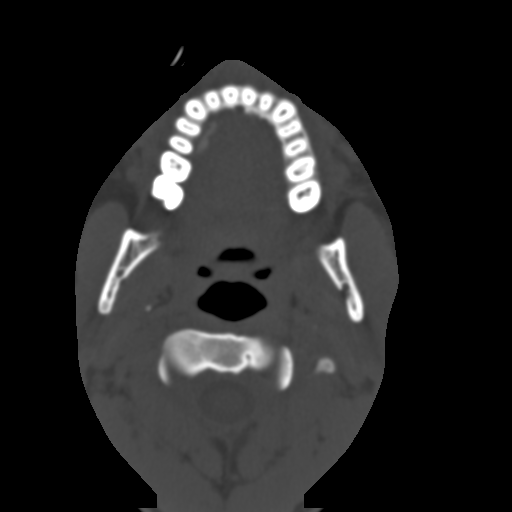
[im 49/89  bone]
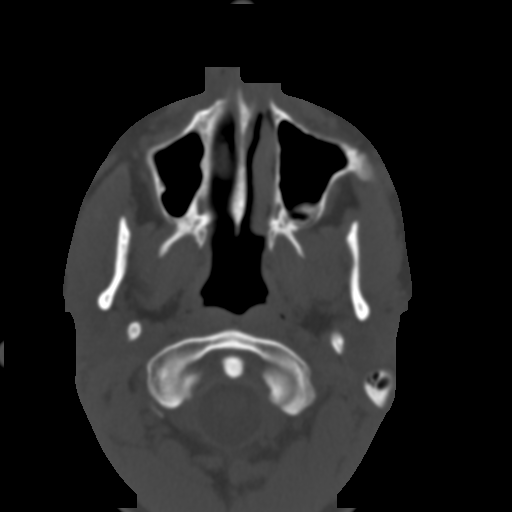
[im 58/89  bone]
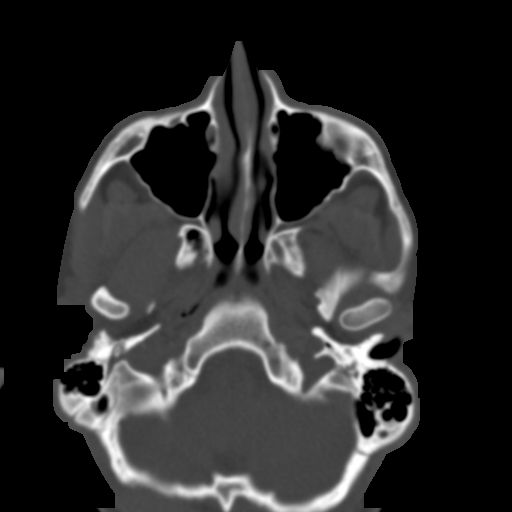
[im 67/89  bone]
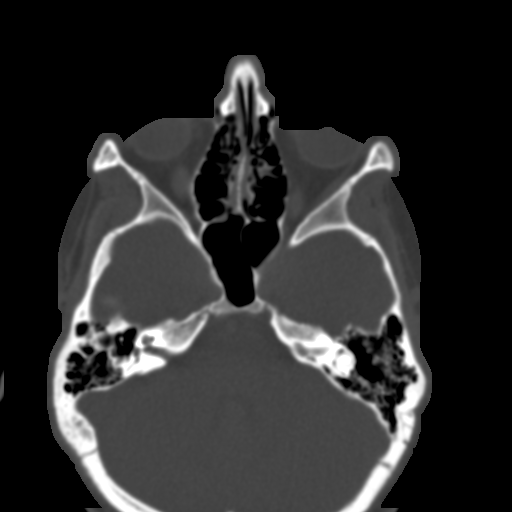
[im 73/89  brain]
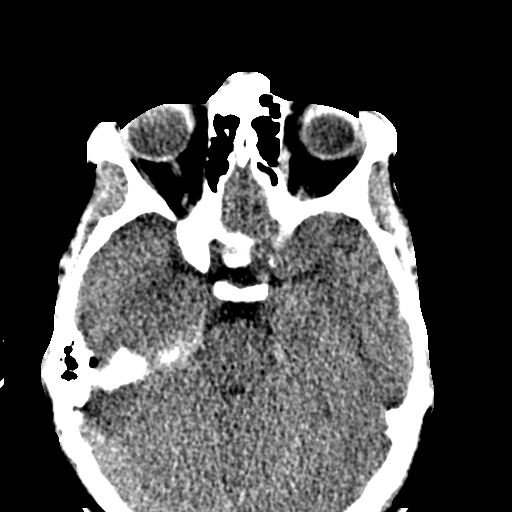
[im 73/89  bone]
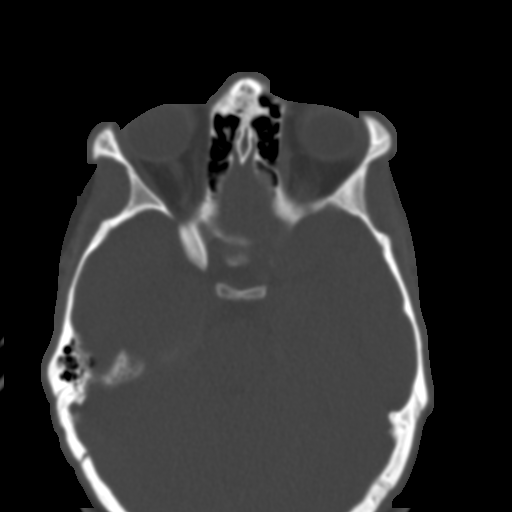
[im 82/89  bone]
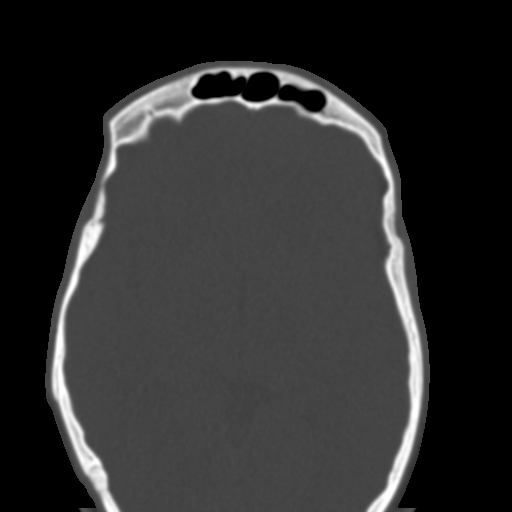

[Series 7: coronal soft tissue · coronal · 0.34mm/px · 3 of 76 slices shown]
[im 26/76  bone]
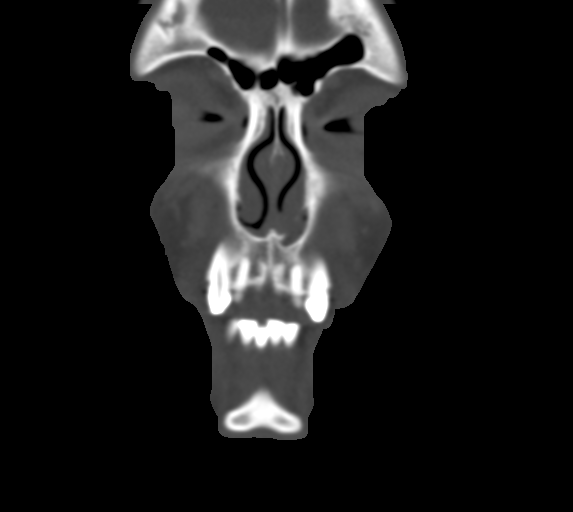
[im 34/76  bone]
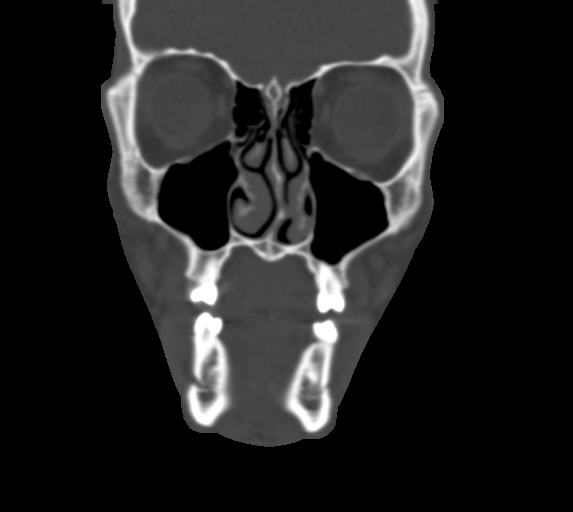
[im 42/76  bone]
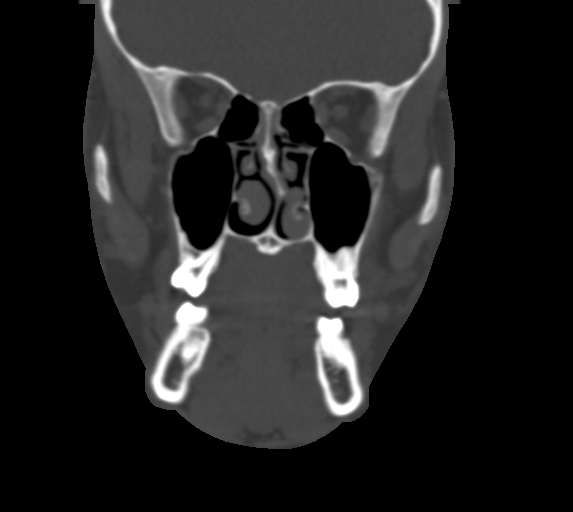

[Series 8: sagittal soft tissue · sagittal · 0.29mm/px · 3 of 99 slices shown]
[im 33/99  bone]
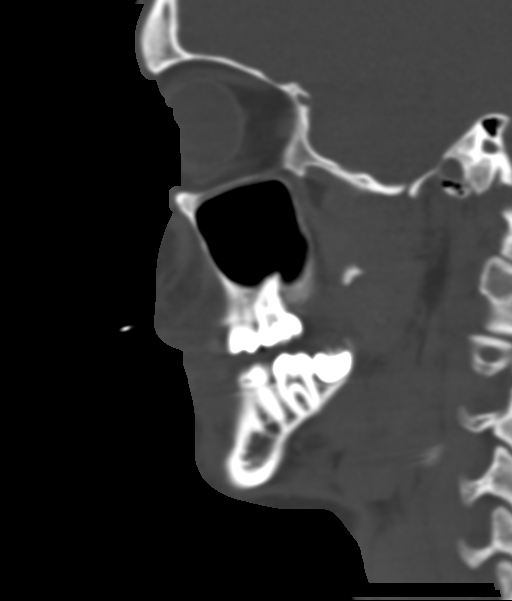
[im 50/99  bone]
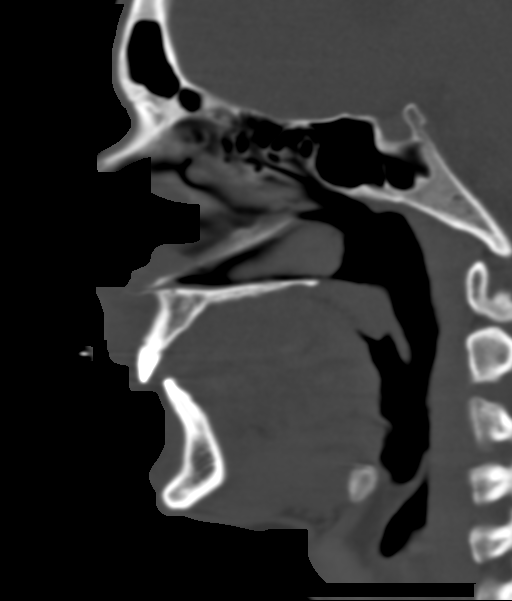
[im 66/99  bone]
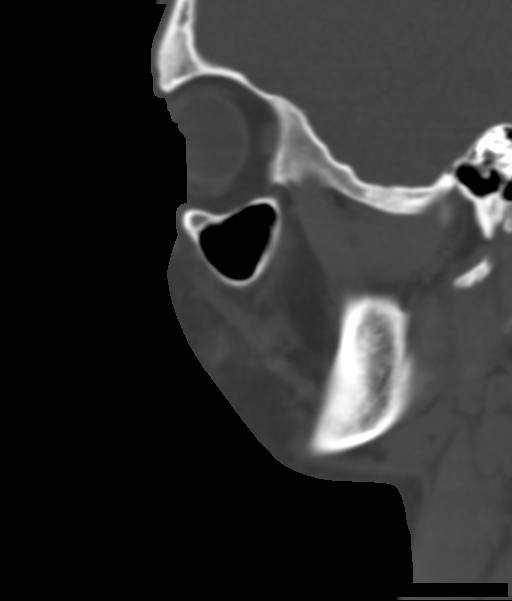

[16 of 47 positions shown; findings below may reference images not displayed]

FINDINGS: Osseous: There is a minimally displaced fracture through the vomer.
No other acute displaced facial bone fractures.

Orbits: Negative. No traumatic or inflammatory finding.

Sinuses: Clear.

Soft tissues: There is soft tissue edema of the inferior aspect of
the nasal septum and overlying the maxilla.

Limited intracranial: No significant or unexpected finding.
IMPRESSION: 1. Minimally displaced vomer fracture.
2. Otherwise unremarkable exam.

## 2022-07-18 ENCOUNTER — Telehealth: Payer: Self-pay

## 2022-07-18 NOTE — Telephone Encounter (Signed)
LVM for patient to call back [phone], or to call PCP office to schedule follow up apt. AS, CMA  

## 2023-06-26 ENCOUNTER — Ambulatory Visit: Admitting: Family Medicine

## 2024-03-17 ENCOUNTER — Ambulatory Visit: Admitting: Family Medicine
